# Patient Record
Sex: Male | Born: 1940 | Race: Black or African American | Hispanic: No | Smoking: Former smoker
Health system: Southern US, Community
[De-identification: ages and names within clinical notes are randomized; demographics above are authoritative.]

## PROBLEM LIST (undated history)

## (undated) DIAGNOSIS — S0990XA Unspecified injury of head, initial encounter: Secondary | ICD-10-CM

## (undated) DIAGNOSIS — I2510 Atherosclerotic heart disease of native coronary artery without angina pectoris: Secondary | ICD-10-CM

## (undated) DIAGNOSIS — R42 Dizziness and giddiness: Secondary | ICD-10-CM

## (undated) HISTORY — PX: CARDIAC CATHETERIZATION: SHX172

## (undated) HISTORY — PX: CORONARY ANGIOPLASTY: SHX604

## (undated) HISTORY — PX: ANGIOPLASTY: SHX39

---

## 2013-12-29 ENCOUNTER — Encounter (HOSPITAL_COMMUNITY): Payer: Self-pay | Admitting: Emergency Medicine

## 2013-12-29 ENCOUNTER — Emergency Department (HOSPITAL_COMMUNITY): Payer: Medicare Other

## 2013-12-29 ENCOUNTER — Observation Stay (HOSPITAL_COMMUNITY)
Admission: EM | Admit: 2013-12-29 | Discharge: 2013-12-30 | Disposition: A | Discharge status: Home / Self Care | Payer: Medicare Other | Attending: Internal Medicine | Admitting: Internal Medicine

## 2013-12-29 DIAGNOSIS — Z87891 Personal history of nicotine dependence: Secondary | ICD-10-CM | POA: Insufficient documentation

## 2013-12-29 DIAGNOSIS — G459 Transient cerebral ischemic attack, unspecified: Principal | ICD-10-CM

## 2013-12-29 DIAGNOSIS — R42 Dizziness and giddiness: Secondary | ICD-10-CM | POA: Insufficient documentation

## 2013-12-29 DIAGNOSIS — I2510 Atherosclerotic heart disease of native coronary artery without angina pectoris: Secondary | ICD-10-CM | POA: Insufficient documentation

## 2013-12-29 HISTORY — DX: Atherosclerotic heart disease of native coronary artery without angina pectoris: I25.10

## 2013-12-29 HISTORY — DX: Dizziness and giddiness: R42

## 2013-12-29 HISTORY — DX: Unspecified injury of head, initial encounter: S09.90XA

## 2013-12-29 LAB — RAPID URINE DRUG SCREEN, HOSP PERFORMED
Amphetamines: NOT DETECTED
Barbiturates: NOT DETECTED
Benzodiazepines: NOT DETECTED
Cocaine: NOT DETECTED
Opiates: NOT DETECTED
Tetrahydrocannabinol: NOT DETECTED

## 2013-12-29 LAB — URINALYSIS, ROUTINE W REFLEX MICROSCOPIC
Bilirubin Urine: NEGATIVE
Glucose, UA: NEGATIVE mg/dL
Hgb urine dipstick: NEGATIVE
Ketones, ur: NEGATIVE mg/dL
Leukocytes, UA: NEGATIVE
Nitrite: NEGATIVE
Protein, ur: NEGATIVE mg/dL
Specific Gravity, Urine: 1.012 (ref 1.005–1.030)
Urobilinogen, UA: 0.2 mg/dL (ref 0.0–1.0)
pH: 6 (ref 5.0–8.0)

## 2013-12-29 LAB — CBC
HCT: 38.8 % — ABNORMAL LOW (ref 39.0–52.0)
Hemoglobin: 13 g/dL (ref 13.0–17.0)
MCH: 29.2 pg (ref 26.0–34.0)
MCHC: 33.5 g/dL (ref 30.0–36.0)
MCV: 87.2 fL (ref 78.0–100.0)
Platelets: 348 10*3/uL (ref 150–400)
RBC: 4.45 MIL/uL (ref 4.22–5.81)
RDW: 12.9 % (ref 11.5–15.5)
WBC: 6.4 K/uL (ref 4.0–10.5)

## 2013-12-29 LAB — COMPREHENSIVE METABOLIC PANEL
ALT: 6 U/L (ref 0–53)
Albumin: 4.1 g/dL (ref 3.5–5.2)
Alkaline Phosphatase: 75 U/L (ref 39–117)
Glucose, Bld: 87 mg/dL (ref 70–99)
Potassium: 3.8 mEq/L (ref 3.7–5.3)
Sodium: 139 mEq/L (ref 137–147)
Total Protein: 7.2 g/dL (ref 6.0–8.3)

## 2013-12-29 LAB — COMPREHENSIVE METABOLIC PANEL WITH GFR
AST: 22 U/L (ref 0–37)
BUN: 13 mg/dL (ref 6–23)
CO2: 24 meq/L (ref 19–32)
Calcium: 9.3 mg/dL (ref 8.4–10.5)
Chloride: 103 meq/L (ref 96–112)
Creatinine, Ser: 1.31 mg/dL (ref 0.50–1.35)
GFR calc Af Amer: 62 mL/min — ABNORMAL LOW (ref >90)
GFR calc non Af Amer: 53 mL/min — ABNORMAL LOW (ref >90)
Total Bilirubin: 0.2 mg/dL — ABNORMAL LOW (ref 0.3–1.2)

## 2013-12-29 LAB — DIFFERENTIAL
Basophils Absolute: 0 K/uL (ref 0.0–0.1)
Basophils Relative: 0 % (ref 0–1)
Eosinophils Absolute: 0.1 K/uL (ref 0.0–0.7)
Eosinophils Relative: 1 % (ref 0–5)
Lymphocytes Relative: 14 % (ref 12–46)
Lymphs Abs: 0.9 K/uL (ref 0.7–4.0)
Monocytes Absolute: 0.5 K/uL (ref 0.1–1.0)
Monocytes Relative: 7 % (ref 3–12)
Neutro Abs: 5 K/uL (ref 1.7–7.7)
Neutrophils Relative %: 78 % — ABNORMAL HIGH (ref 43–77)

## 2013-12-29 LAB — I-STAT CHEM 8, ED
BUN: 12 mg/dL (ref 6–23)
Calcium, Ion: 1.19 mmol/L (ref 1.13–1.30)
Chloride: 103 mEq/L (ref 96–112)
Creatinine, Ser: 1.4 mg/dL — ABNORMAL HIGH (ref 0.50–1.35)
Glucose, Bld: 87 mg/dL (ref 70–99)
HCT: 41 % (ref 39.0–52.0)
Hemoglobin: 13.9 g/dL (ref 13.0–17.0)
Potassium: 3.9 mEq/L (ref 3.7–5.3)
Sodium: 143 mEq/L (ref 137–147)
TCO2: 26 mmol/L (ref 0–100)

## 2013-12-29 LAB — I-STAT TROPONIN, ED: Troponin i, poc: 0 ng/mL (ref 0.00–0.08)

## 2013-12-29 LAB — PROTIME-INR
INR: 0.94 (ref 0.00–1.49)
Prothrombin Time: 12.4 s (ref 11.6–15.2)

## 2013-12-29 LAB — APTT: aPTT: 31 s (ref 24–37)

## 2013-12-29 LAB — ETHANOL: Alcohol, Ethyl (B): <11 mg/dL (ref 0–11)

## 2013-12-29 MED ORDER — ASPIRIN 81 MG PO CHEW
162.0000 mg | CHEWABLE_TABLET | Freq: Once | ORAL | Status: AC
  Administered 2013-12-29: 162 mg via ORAL
  Filled 2013-12-29: qty 2

## 2013-12-29 NOTE — ED Notes (Signed)
Ginger ale, Malawiturkey sandwich, applesauce provided to the patient.

## 2013-12-29 NOTE — H&P (Signed)
[facility] History and Physical  [name] [name] EAV:[id number] DOB: [birth year] DOA: 12/29/2013  Referring physician: ER physician. PCP: No PCP Per Patient patient just moved from [place] in last December.  Chief Complaint: Difficulty speaking.  HPI: [name] [name] is a 73 y.o. male with history of CAD status post stenting and blunt head trauma as a child presents to the ER because of difficulty speaking. Patient's symptoms happened when he was with his nephew at his house at around 5 PM and his symptoms lasted for around 5 minutes and resolved completely. Patient was in the standing position when he suddenly developed difficulty bringing out words and also felt dizzy and some visual blurriness. He stated that he also felt confused. Patient states that he has had previous episodes of these and in 2013 was admitted in a hospital in [place]. In the ER patient was found to be nonfocal and CT head did not show anything acute. On-call neurologist Dr. Roseanne RenoStewart was consulted by ED physician Dr. Oletta LamasGhim and [facility] work[facility]. Patient denies any chest pain shortness of breath nausea vomiting abdominal pain diarrhea.   Review of Systems: As presented in the history of presenting illness, rest negative.  Past Medical History  Diagnosis Date  . Head trauma in child   . Vertigo   . Coronary artery disease    Past Surgical History  Procedure Laterality Date  . Angioplasty    . Cardiac catheterization    . Coronary angioplasty     Social History:  reports that he quit smoking about 20 years ago. His smoking use included Cigarettes. He smoked 0.00 packs per day for 15 years. He has never used smokeless tobacco. He reports that he does not drink alcohol or use illicit drugs. Where does patient live home. Can patient participate in ADLs? Yes.  No Known Allergies  Family History:  Family History  Problem Relation Age of Onset  . Cancer Mother    . Stroke Father       Prior to Admission medications   Not on File    Physical Exam: Filed Vitals:   12/29/13 2030 12/29/13 2100 12/29/13 2130 12/29/13 2200  BP: 122/82 131/76 130/83 127/79  Pulse: 62 61 63 67  Temp:      TempSrc:      Resp: 19 20 20 14   SpO2: 100% 99% 98% 99%     General:  Well-developed and moderately nourished.  Eyes: Anicteric no pallor.  ENT: No discharge from the ears eyes nose mouth.  Neck: No mass felt.  Cardiovascular: S1-S2 heard.  Respiratory: No rhonchi or crepitations.  Abdomen: Soft nontender bowel sounds present. No guarding or rigidity.  Skin: No rash.  Musculoskeletal: No edema.  Psychiatric: Appears normal.  Neurologic: Alert awake oriented to time place and person. Moves all extremities 5 x 5. No facial asymmetry. Tongue is midline.  Labs on Admission:  Basic Metabolic Panel:  Recent Labs Lab 12/29/13 1853 12/29/13 1917  NA 139 143  K 3.8 3.9  CL 103 103  CO2 24  --   GLUCOSE 87 87  BUN 13 12  CREATININE 1.31 1.40*  CALCIUM 9.3  --    Liver Function Tests:  Recent Labs Lab 12/29/13 1853  AST 22  ALT 6  ALKPHOS 75  BILITOT 0.2*  PROT 7.2  ALBUMIN 4.1   No results found for this basename: LIPASE, AMYLASE,  in the last 168 hours No results found for  this basename: AMMONIA,  in the last 168 hours CBC:  Recent Labs Lab 12/29/13 1853 12/29/13 1917  WBC 6.4  --   NEUTROABS 5.0  --   HGB 13.0 13.9  HCT 38.8* 41.0  MCV 87.2  --   PLT 348  --    Cardiac Enzymes: No results found for this basename: CKTOTAL, CKMB, CKMBINDEX, TROPONINI,  in the last 168 hours  BNP (last 3 results) No results found for this basename: PROBNP,  in the last 8760 hours CBG: No results found for this basename: GLUCAP,  in the last 168 hours  Radiological Exams on Admission: Ct Head Wo Contrast  12/29/2013   CLINICAL DATA:  Temporary episode of aphasia with altered mental status.  EXAM: CT HEAD WITHOUT CONTRAST  TECHNIQUE:  Contiguous axial images were obtained from the base of the skull through the vertex without intravenous contrast.  COMPARISON:  None.  FINDINGS: No evidence of an acute infarct, acute hemorrhage, mass lesion, mass effect or hydrocephalus. Mild atrophy. Mild periventricular low attenuation. Visualized portions of the paranasal sinuses and mastoid air cells are clear.  IMPRESSION: 1. No acute intracranial abnormality. 2. Atrophy and chronic microvascular white matter ischemic changes.   Electronically Signed   By: Leanna BattlesMelinda  Blietz M.D.   On: 12/29/2013 19:58    EKG: Independently reviewed. Normal sinus rhythm with early repolarization changes. No old EKG to compare. I did discuss with on-call cardiologist Dr. Terressa KoyanagiBalfour.  Assessment/Plan Principal Problem:   TIA (transient ischemic attack) Active Problems:   CAD (coronary artery disease)   1. TIA - patient's symptoms are consistent with possible TIA. At this time [facility] and patient is in agreement for transfer. Dr. Lynden OxfordPranav Patel will be the accepting physician. On-call neurologist aware of transfer. Patient will be placed on neurochecks swallow evaluation. Check MRI/MRA brain, carotid Doppler, 2-D echo, lipid panel and hemoglobin A1c. Aspirin. Monitor in telemetry for any arrhythmias. 2. History of CAD status post stenting - denies any chest pain. I did discuss the case with on-call cardiologist Dr. Terressa KoyanagiBalfour. Dr. Terressa KoyanagiBalfour feels the EKG changes are consistent with early repolarization.    Code Status: Full code.  Family Communication: Patient's nephew at the bedside.  Disposition Plan: Admit for observation.    Eduard ClosArshad N Sephira Zellman [facility] Pager [phone].  If 7PM-7AM, please contact night-coverage [email or web address] Password [email or web address] 12/29/2013, 10:18 PM

## 2013-12-29 NOTE — Progress Notes (Signed)
  CARE MANAGEMENT ED NOTE 12/29/2013  Patient:  [name] [name]   Account Number:  [id number]  Date Initiated:  12/29/2013  Documentation initiated by:  Radford PaxFERRERO,Georgeana Oertel  Subjective/Objective Assessment:   Patient presents to Ed with speech difficulties and dizziness.     Subjective/Objective Assessment Detail:   Patient without prior history of TIA or stroke.     Action/Plan:   Action/Plan Detail:   Anticipated DC Date:       Status Recommendation to Physician:   Result of Recommendation:    Other ED Services  Consult Working Plan    DC Planning Services  Other  PCP issues    Choice offered to / List presented to:            Status of service:  Completed, signed off  ED Comments:   ED Comments Detail:  EDCM spoke to patient's nephew at bedside as patient was having CT scan completed.  Patient's nephew Ed reports patient is much better now.  Patient without a pcp with Medicare insurance, [place]  [facility] provided patient's nephew with list of physicians who accept Medicare insurance within a five mile radius of patient's zip code.  Patient came back from CT and advised him of the same.  No further EDCM needs at this time.

## 2013-12-29 NOTE — ED Provider Notes (Signed)
CSN: [id number]     Arrival date & time 12/29/13  1744 History   First MD Initiated Contact with Patient 12/29/13 1840     Chief Complaint  Patient presents with  . Altered Mental Status  . Aphasia     (Consider location/radiation/quality/duration/timing/severity/associated sxs/prior Treatment) HPI Comments: Pt with h/o possible angioplasty in [place] years ago, now on no medications, had exercised at home, walking outside for about 1 hour, gotten home, was doing chores and began cooking.  Nephew who lives with pt got home around 5:30 and pt had come upstairs to talk to nephwe.  Pt reports feeling vertigo like symptoms of dizziness, and when he tried to speak to nephew, only gibberish was coming out, couldn't speak.  Pt reports some visual changes, non specific.  Pt insists he knew what he wanted to say but couldn't get it out.  No definite right or left arm or leg weakness.  No prior h/o stroke, no smoking or drinking alcohol  No family h/o stroke.  Pt's symptoms much improved now.    Patient is a 73 y.o. male presenting with altered mental status. The history is provided by the patient and a relative.  Altered Mental Status Severity:  Moderate Most recent episode:  Today Episode history:  Single Duration:  1 hour Timing:  Constant Progression:  Improving Chronicity:  New Context: not alcohol use, not dementia, not drug use, not head injury, not homeless, taking medications as prescribed, not a nursing home resident and not a recent infection   Associated symptoms: light-headedness   Associated symptoms: no abdominal pain, no difficulty breathing, no fever, no nausea, no vomiting and no weakness     Past Medical History  Diagnosis Date  . Head trauma in child   . Vertigo    Past Surgical History  Procedure Laterality Date  . Angioplasty     History reviewed. No pertinent family history. History  Substance Use Topics  . Smoking status: Former Smoker -- 15 years    Types:  Cigarettes    Quit date: 12/29/1993  . Smokeless tobacco: Never Used  . Alcohol Use: No    Review of Systems  Constitutional: Negative for fever and chills.  Eyes: Positive for visual disturbance.  Respiratory: Negative for chest tightness.   Cardiovascular: Negative for chest pain.  Gastrointestinal: Negative for nausea, vomiting and abdominal pain.  Musculoskeletal: Negative for back pain.  Neurological: Positive for dizziness, speech difficulty and light-headedness. Negative for weakness.  All other systems reviewed and are negative.     Allergies  Review of patient's allergies indicates no known allergies.  Home Medications   Prior to Admission medications   Not on File   BP 125/79  Pulse 62  Temp(Src) 98.7 F (37.1 C) (Oral)  Resp 17  SpO2 99% Physical Exam  Nursing note and vitals reviewed. Constitutional: He is oriented to person, place, and time. He appears well-developed and well-nourished.  HENT:  Head: Normocephalic and atraumatic.  Eyes: Conjunctivae are normal. No scleral icterus.  Neck: Normal range of motion. Neck supple.  Cardiovascular: Normal rate, regular rhythm and intact distal pulses.   No murmur heard. Pulmonary/Chest: Effort normal. No respiratory distress. He has no wheezes. He has no rales.  Abdominal: Soft. He exhibits no distension. There is no tenderness. There is no rebound and no guarding.  Neurological: He is alert and oriented to person, place, and time. He displays normal reflexes. No cranial nerve deficit. He exhibits normal muscle tone. Coordination normal.  No arm or leg drift, peripheral vision intact, CN 2-12 intact.  5/5 distal strength in B UE and LE's.  Normal finger to nose.  Slight hesitancy in speech in my opinion, but family and pt both report speech is now at baseline  Skin: Skin is warm and dry. No rash noted.  Psychiatric: He has a normal mood and affect.    ED Course  Procedures (including critical care  time)  CRITICAL CARE Performed by: Gavin PoundMichael Y. Chrisean Kloth Total critical care time: 30 min Critical care time was exclusive of separately billable procedures and treating other patients. Critical care was necessary to treat or prevent imminent or life-threatening deterioration. Critical care was time spent personally by me on the following activities: development of treatment plan with patient and/or surrogate as well as nursing, discussions with consultants, evaluation of patient's response to treatment, examination of patient, obtaining history from patient or surrogate, ordering and performing treatments and interventions, ordering and review of laboratory studies, ordering and review of radiographic studies, pulse oximetry and re-evaluation of patient's condition.    Labs Review Labs Reviewed  CBC - Abnormal; Notable for the following:    HCT 38.8 (*)    All other components within normal limits  DIFFERENTIAL - Abnormal; Notable for the following:    Neutrophils Relative % 78 (*)    All other components within normal limits  COMPREHENSIVE METABOLIC PANEL - Abnormal; Notable for the following:    Total Bilirubin 0.2 (*)    GFR calc non Af Amer 53 (*)    GFR calc Af Amer 62 (*)    All other components within normal limits  I-STAT CHEM 8, ED - Abnormal; Notable for the following:    Creatinine, Ser 1.40 (*)    All other components within normal limits  ETHANOL  PROTIME-INR  APTT  URINE RAPID DRUG SCREEN (HOSP PERFORMED)  URINALYSIS, ROUTINE W REFLEX MICROSCOPIC  I-STAT TROPOININ, ED    Imaging Review Ct Head Wo Contrast  12/29/2013   CLINICAL DATA:  Temporary episode of aphasia with altered mental status.  EXAM: CT HEAD WITHOUT CONTRAST  TECHNIQUE: Contiguous axial images were obtained from the base of the skull through the vertex without intravenous contrast.  COMPARISON:  None.  FINDINGS: No evidence of an acute infarct, acute hemorrhage, mass lesion, mass effect or hydrocephalus.  Mild atrophy. Mild periventricular low attenuation. Visualized portions of the paranasal sinuses and mastoid air cells are clear.  IMPRESSION: 1. No acute intracranial abnormality. 2. Atrophy and chronic microvascular white matter ischemic changes.   Electronically Signed   By: Leanna BattlesMelinda  Blietz M.D.   On: 12/29/2013 19:58     EKG Interpretation   Date/Time:  Monday December 29 2013 18:46:31 EDT Ventricular Rate:  67 PR Interval:  171 QRS Duration: 87 QT Interval:  399 QTC Calculation: 421 R Axis:   -43 Text Interpretation:  Sinus rhythm Left anterior fascicular block  Borderline ST elevation, anterolateral leads Abnormal ekg No previous  tracing Confirmed by [facility]  MD, MICHEAL ([id number]) on 12/29/2013 7:05:56 PM     RA sat is 100% and I interpret to be normal  8:23 PM Reviewed head CT and results.  I spoke to Dr. Roseanne RenoStewart, neuro hospitalist who agrees [facility].  I have informed pt and family.  Awaiting admission to hospitalist.  ASA ordered     MDM   Final diagnoses:  TIA (transient ischemic attack)    Pt with symptoms consistent with  a TIA.  AT this point, symptoms of CVA are resolved, no difficulty with word finding or speech.  Thus not a TPA candidate.  Will need admission for TIA.  No PCP as he moved here from [place] 5 months ago, has not established with anyone.      Gavin PoundMichael Y. Oletta LamasGhim, MD 12/29/13 2023

## 2013-12-29 NOTE — ED Notes (Signed)
Hospitalist at bedside 

## 2013-12-29 NOTE — ED Notes (Signed)
Biotene mouthwash provided to pt per request.

## 2013-12-29 NOTE — ED Notes (Addendum)
Pt from home c/o trouble speaking and pt states " I had a vertigo feeling after walking/running" Pt alert and oriented. Speech symptoms resolved. Nephew feels pt is back to normal.Last seen "normal" was at 4:30pm today.

## 2013-12-29 NOTE — ED Notes (Signed)
Per family, pt was exercising about 1 1/2 hours ago.  Pt had speech changes and could not get his words for about 5 min.  Per family speech is back to normal.  Pt bp checked by family and stable during event.

## 2013-12-29 NOTE — ED Notes (Signed)
[facility] at bedside 

## 2013-12-29 NOTE — ED Notes (Signed)
Pt returned from CT °

## 2013-12-29 NOTE — ED Notes (Signed)
MD Ghim at bedside. 

## 2013-12-30 ENCOUNTER — Observation Stay (HOSPITAL_COMMUNITY): Payer: Medicare Other

## 2013-12-30 DIAGNOSIS — I359 Nonrheumatic aortic valve disorder, unspecified: Secondary | ICD-10-CM

## 2013-12-30 LAB — TSH: TSH: 3.02 u[IU]/mL (ref 0.350–4.500)

## 2013-12-30 LAB — LIPID PANEL
Cholesterol: 175 mg/dL (ref 0–200)
HDL: 49 mg/dL (ref >39)
LDL Cholesterol: 112 mg/dL — ABNORMAL HIGH (ref 0–99)
Total CHOL/HDL Ratio: 3.6 RATIO
Triglycerides: 68 mg/dL (ref <150)
VLDL: 14 mg/dL (ref 0–40)

## 2013-12-30 LAB — CBC WITH DIFFERENTIAL/PLATELET
Basophils Absolute: 0 10*3/uL (ref 0.0–0.1)
Basophils Relative: 1 % (ref 0–1)
Eosinophils Absolute: 0.1 10*3/uL (ref 0.0–0.7)
Eosinophils Relative: 2 % (ref 0–5)
HCT: 37.8 % — ABNORMAL LOW (ref 39.0–52.0)
Hemoglobin: 12.8 g/dL — ABNORMAL LOW (ref 13.0–17.0)
LYMPHS PCT: 23 % (ref 12–46)
Lymphs Abs: 1.2 10*3/uL (ref 0.7–4.0)
MCH: 30 pg (ref 26.0–34.0)
MCHC: 33.9 g/dL (ref 30.0–36.0)
MCV: 88.7 fL (ref 78.0–100.0)
Monocytes Absolute: 0.5 10*3/uL (ref 0.1–1.0)
Monocytes Relative: 10 % (ref 3–12)
Neutro Abs: 3.4 10*3/uL (ref 1.7–7.7)
Neutrophils Relative %: 64 % (ref 43–77)
PLATELETS: 312 10*3/uL (ref 150–400)
RBC: 4.26 MIL/uL (ref 4.22–5.81)
RDW: 13.2 % (ref 11.5–15.5)
WBC: 5.3 10*3/uL (ref 4.0–10.5)

## 2013-12-30 LAB — COMPREHENSIVE METABOLIC PANEL
ALBUMIN: 3.4 g/dL — AB (ref 3.5–5.2)
ALT: 7 U/L (ref 0–53)
AST: 18 U/L (ref 0–37)
Alkaline Phosphatase: 68 U/L (ref 39–117)
BILIRUBIN TOTAL: 0.2 mg/dL — AB (ref 0.3–1.2)
BUN: 14 mg/dL (ref 6–23)
CHLORIDE: 107 meq/L (ref 96–112)
CO2: 25 mEq/L (ref 19–32)
Calcium: 9.5 mg/dL (ref 8.4–10.5)
Creatinine, Ser: 1.2 mg/dL (ref 0.50–1.35)
GFR calc Af Amer: 68 mL/min — ABNORMAL LOW (ref >90)
GFR calc non Af Amer: 59 mL/min — ABNORMAL LOW (ref >90)
Glucose, Bld: 88 mg/dL (ref 70–99)
POTASSIUM: 3.9 meq/L (ref 3.7–5.3)
SODIUM: 143 meq/L (ref 137–147)
Total Protein: 6.5 g/dL (ref 6.0–8.3)

## 2013-12-30 LAB — TROPONIN I
Troponin I: <0.3 ng/mL (ref <0.30)
Troponin I: <0.3 ng/mL (ref <0.30)

## 2013-12-30 LAB — HEMOGLOBIN A1C
Hgb A1c MFr Bld: 5.9 % — ABNORMAL HIGH (ref <5.7)
MEAN PLASMA GLUCOSE: 123 mg/dL — AB (ref <117)

## 2013-12-30 LAB — GLUCOSE, CAPILLARY
GLUCOSE-CAPILLARY: 91 mg/dL (ref 70–99)
Glucose-Capillary: 85 mg/dL (ref 70–99)

## 2013-12-30 MED ORDER — SIMVASTATIN 10 MG PO TABS
10.0000 mg | ORAL_TABLET | Freq: Every day | ORAL | Status: DC
  Filled 2013-12-30: qty 1

## 2013-12-30 MED ORDER — SODIUM CHLORIDE 0.9 % IV SOLN
INTRAVENOUS | Status: DC
  Administered 2013-12-30: 1000 mL via INTRAVENOUS

## 2013-12-30 MED ORDER — SIMVASTATIN 10 MG PO TABS: 10.0000 mg | ORAL_TABLET | Freq: Every day | ORAL | Status: AC

## 2013-12-30 MED ORDER — ACETAMINOPHEN 325 MG PO TABS: 650.0000 mg | ORAL_TABLET | ORAL | Status: DC | PRN

## 2013-12-30 MED ORDER — ASPIRIN 325 MG PO TABS
325.0000 mg | ORAL_TABLET | Freq: Every day | ORAL | Status: DC
  Filled 2013-12-30: qty 1

## 2013-12-30 MED ORDER — ASPIRIN EC 325 MG PO TBEC: 325.0000 mg | DELAYED_RELEASE_TABLET | Freq: Every day | ORAL | Status: AC

## 2013-12-30 MED ORDER — ENOXAPARIN SODIUM 40 MG/0.4ML ~~LOC~~ SOLN
40.0000 mg | SUBCUTANEOUS | Status: DC
  Filled 2013-12-30: qty 0.4

## 2013-12-30 NOTE — Progress Notes (Signed)
PT Cancellation Note  Patient Details Name: Gwinda PasseWinfred Dierks MRN: [id number] DOB: 12-Jun-1941   Cancelled Treatment:    Reason Eval/Treat Not Completed: Patient at procedure or test/unavailable   Vidur Knust B Loucille Takach 12/30/2013, 8:11 AM Delaney MeigsMaija Tabor Parveen Freehling, PT [phone]

## 2013-12-30 NOTE — Progress Notes (Signed)
Follow-up:  Was asked to see pt s/p [facility]. [facility] on the evening of 12/29/13 reporting an episode of difficulty speaking. Symptoms were also associated w/ some dizziness and blurred vision. In ED pt's exam was noted to be non-focal and a Ct head was w/o acute findings. Neurology was consulted and requested [facility] be [facility]. At bedside pt noted awake, alert and oriented x 3. He denies c/o at this time. Current VSS. Cardiac telemetry reveals NSR w/ rate of 66. Will continue to monitor closely on telemetry.   Leanne ChangKatherine P. Marjan Rosman, NP-C [facility] Pager [phone]

## 2013-12-30 NOTE — Evaluation (Signed)
Physical Therapy Evaluation/ Discharge Patient Details Name: [name] [name] MRN: [id number] DOB: [birth year] Today's Date: 12/30/2013   History of Present Illness  [name] [name] is a 73 y.o. male with history of CAD status post stenting and blunt head trauma as a child presents to the ER because of difficulty speaking. Patient's symptoms happened when he was with his nephew at his house at around 5 PM and his symptoms lasted for around 5 minutes and resolved completely. CT/ MRI (-)  Clinical Impression  Pt very pleasant with equal strength and sensation bil UE and bil LE. Pt without dizziness, LOB or difficulty with mobility with quick gait. Pt noted to have difficulty with smooth pursuit with visual tracking as well as visual saccades. Pt with improved visual tracking with practice but difficulty maintaining target throughout and pt educated for tracking and saccade exercises to improve coordination. Pt able to copy picture with all parts intact and able to read and follow directional signs without difficulty. Pt at baseline for mobility and balance with all education completed and no further needs, pt aware and agreeable, will sign off.     Follow Up Recommendations No PT follow up    Equipment Recommendations  None recommended by PT    Recommendations for Other Services       Precautions / Restrictions Precautions Precautions: None      Mobility  Bed Mobility Overal bed mobility: Independent                Transfers Overall transfer level: Independent                  Ambulation/Gait Ambulation/Gait assistance: Independent Ambulation Distance (Feet): 350 Feet Assistive device: None Gait Pattern/deviations: WFL(Within Functional Limits)        Stairs Stairs: Yes Stairs assistance: Independent Stair Management: No rails;Alternating pattern;Forwards Number of Stairs: 12    Wheelchair Mobility    Modified Rankin (Stroke Patients Only)       Balance  Overall balance assessment: No apparent balance deficits (not formally assessed)                                           Pertinent Vitals/Pain No pain VSS    Home Living Family/patient expects to be discharged to:: Private residence Living Arrangements: Other relatives Available Help at Discharge: Family;Available 24 hours/day Type of Home: Apartment Home Access: Stairs to enter   Entrance Stairs-Number of Steps: 8 Home Layout: Two level Home Equipment: None      Prior Function Level of Independence: Independent               Hand Dominance        Extremity/Trunk Assessment   Upper Extremity Assessment: Overall WFL for tasks assessed           Lower Extremity Assessment: Overall WFL for tasks assessed      Cervical / Trunk Assessment: Normal  Communication   Communication: No difficulties  Cognition Arousal/Alertness: Awake/alert Behavior During Therapy: WFL for tasks assessed/performed Overall Cognitive Status: Within Functional Limits for tasks assessed                      General Comments      Exercises        Assessment/Plan    PT Assessment Patent does not need any further PT services  PT Diagnosis  PT Problem List    PT Treatment Interventions     PT Goals (Current goals can be found in the Care Plan section) Acute Rehab PT Goals PT Goal Formulation: No goals set, d/c therapy    Frequency     Barriers to discharge        Co-evaluation               End of Session   Activity Tolerance: Patient tolerated treatment well Patient left: in bed;with call bell/phone within reach Nurse Communication: Mobility status    Functional Assessment Tool Used: clinical judgement Functional Limitation: Mobility: Walking and moving around Mobility: Walking and Moving Around Current Status ([id number](G8978): 0 percent impaired, limited or restricted Mobility: Walking and Moving Around Goal Status [phone](G8979): 0 percent  impaired, limited or restricted Mobility: Walking and Moving Around Discharge Status [phone](G8980): 0 percent impaired, limited or restricted    Time: 1215-1240 PT Time Calculation (min): 25 min   Charges:   PT Evaluation $Initial PT Evaluation Tier I: 1 Procedure PT Treatments $Therapeutic Activity: 8-22 mins   PT G Codes:   Functional Assessment Tool Used: clinical judgement Functional Limitation: Mobility: Walking and moving around    [employer]Maija B Patriciaann Rabanal 12/30/2013, 12:48 PM Delaney MeigsMaija Tabor Isel Skufca, PT [phone]

## 2013-12-30 NOTE — Progress Notes (Signed)
Stroke Team Progress Note  HISTORY [name] [name] is an 73 y.o. male history coronary artery disease and remote head trauma, presenting [facility] with transient blurring of vision as well as scotomas, lightheadedness and expressive aphasia. Patient was aware of what was being said to him and do what he wanted to say but had difficulty expressing himself. Speech output was nonsensical. There is no previous history of stroke nor TIA. Patient has not been on antiplatelet therapy. Onset of symptoms was at 5 PM today 12/29/2013. CT scan of his head showed no acute intracranial abnormality. Patient was not administered TPA secondary to deficits resolved. He was admitted and transferred to [facility] for further evaluation and treatment.  SUBJECTIVE No family is at the bedside.  Overall he feels his condition is stable. He believes in healthy living and does not believe in antithrombotics.  OBJECTIVE Most recent Vital Signs: Filed Vitals:   12/29/13 2310 12/30/13 0005 12/30/13 0200 12/30/13 0400  BP:  133/82 127/80 109/55  Pulse: 72 68 63 68  Temp:  98 F (36.7 C)  97.9 F (36.6 C)  TempSrc:      Resp: 22 20  18   Height:  5\' 9"  (1.753 m)    Weight:  70.308 kg (155 lb)    SpO2: 98% 100% 100% 100%   CBG (last 3)   Recent Labs  12/30/13 0835  GLUCAP 85    IV Fluid Intake:   . sodium chloride 1,000 mL (12/30/13 0200)    MEDICATIONS  . aspirin  325 mg Oral Daily  . enoxaparin (LOVENOX) injection  40 mg Subcutaneous Q24H   PRN:  acetaminophen  Diet:  Cardiac thin liquids Activity:   Bathroom privileges with assistance DVT Prophylaxis:  Lovenox 40 mg sq daily   CLINICALLY SIGNIFICANT STUDIES Basic Metabolic Panel:   Recent Labs Lab 12/29/13 1853 12/29/13 1917 12/30/13 0300  NA 139 143 143  K 3.8 3.9 3.9  CL 103 103 107  CO2 24  --  25  GLUCOSE 87 87 88  BUN 13 12 14   CREATININE 1.31 1.40* 1.20  CALCIUM 9.3  --  9.5   Liver Function Tests:   Recent Labs Lab 12/29/13 1853  12/30/13 0300  AST 22 18  ALT 6 7  ALKPHOS 75 68  BILITOT 0.2* 0.2*  PROT 7.2 6.5  ALBUMIN 4.1 3.4*   CBC:   Recent Labs Lab 12/29/13 1853 12/29/13 1917 12/30/13 0300  WBC 6.4  --  5.3  NEUTROABS 5.0  --  3.4  HGB 13.0 13.9 12.8*  HCT 38.8* 41.0 37.8*  MCV 87.2  --  88.7  PLT 348  --  312   Coagulation:   Recent Labs Lab 12/29/13 1853  LABPROT 12.4  INR 0.94   Cardiac Enzymes:   Recent Labs Lab 12/30/13 0300  TROPONINI <0.30   Urinalysis:   Recent Labs Lab 12/29/13 1933  COLORURINE YELLOW  LABSPEC 1.012  PHURINE 6.0  GLUCOSEU NEGATIVE  HGBUR NEGATIVE  BILIRUBINUR NEGATIVE  KETONESUR NEGATIVE  PROTEINUR NEGATIVE  UROBILINOGEN 0.2  NITRITE NEGATIVE  LEUKOCYTESUR NEGATIVE   Lipid Panel    Component Value Date/Time   CHOL 175 12/30/2013 0300   TRIG 68 12/30/2013 0300   HDL 49 12/30/2013 0300   CHOLHDL 3.6 12/30/2013 0300   VLDL 14 12/30/2013 0300   LDLCALC 112* 12/30/2013 0300   HgbA1C  No results found for this basename: HGBA1C    Urine Drug Screen:     Component Value Date/Time  LABOPIA NONE DETECTED 12/29/2013 1933   COCAINSCRNUR NONE DETECTED 12/29/2013 1933   LABBENZ NONE DETECTED 12/29/2013 1933   AMPHETMU NONE DETECTED 12/29/2013 1933   THCU NONE DETECTED 12/29/2013 1933   LABBARB NONE DETECTED 12/29/2013 1933    Alcohol Level:   Recent Labs Lab 12/29/13 1853  ETH <11    CT of the brain  12/29/2013   1. No acute intracranial abnormality. 2. Atrophy and chronic microvascular white matter ischemic changes.   MRI of the brain  12/30/2013    No acute infarct.  Mild small vessel disease type changes.  Cervical spondylotic changes with spinal stenosis and cord flattening C3-4 level.    MRA of the brain  12/30/2013    Exam is motion degraded. This limits evaluation for grading stenosis accurately or detecting small aneurysm. What can be stated with certainty is that there is flow within the major intracranial vessels with right vertebral artery  ending in a right posterior inferior cerebral artery distribution.  Bulbous appearance of the basilar tip directed towards the left. Small basilar tip aneurysm not excluded.   2D Echocardiogram    Carotid Doppler    CXR    EKG  normal sinus rhythm. For complete results please see formal report.   Therapy Recommendations   Physical Exam   Awake alert. Afebrile. Head is nontraumatic. Neck is supple without bruit. Hearing is normal. Cardiac exam no murmur or gallop. Lungs are clear to auscultation. Distal pulses are well felt. Neurological Exam ;  Awake  Alert oriented x 3. Normal speech and language.eye movements full without nystagmus.fundi were not visualized. Vision acuity and fields appear normal. Hearing is normal. Palatal movements are normal. Face symmetric. Tongue midline. Normal strength, tone, reflexes and coordination. Normal sensation. Gait deferred. ASSESSMENT [name] [name] is a 73 y.o. male presenting with Transient dizziness, visual changes and speech difficulty. Imaging negative for acute stroke. Dx:  TIA.   On no antithrombotics prior to admission. Now on aspirin 325 mg orally every day for secondary stroke prevention. Patient with no resultant neuro symptoms. Stroke work up underway.   CAD - angioplasty  Family hx stroke (father)  Hospital day # 1  TREATMENT/PLAN  Continue aspirin 325 mg orally every day for secondary stroke prevention.  Complete stroke workup - 2D, CD   Annie MainSharon Biby, MSN, RN, ANVP-BC, AGPCNP-BC [facility] Pager: [phone] 12/30/2013 9:40 AM  I have personally obtained a history, examined the patient, evaluated imaging results, and formulated the assessment and plan of care. I agree with the above.  Delia HeadyPramod Ruari Mudgett, MD  To contact Stroke Continuity provider, please refer to [email or web address]. After hours, contact General Neurology

## 2013-12-30 NOTE — Discharge Instructions (Signed)
STROKE/TIA DISCHARGE INSTRUCTIONS SMOKING Cigarette smoking nearly doubles your risk of having a stroke & is the single most alterable risk factor  If you smoke or have smoked in the last 12 months, you are advised to quit smoking for your health.  Most of the excess cardiovascular risk related to smoking disappears within a year of stopping.  Ask you doctor about anti-smoking medications  [place] Quit Line: 1-800-QUIT NOW  Free Smoking Cessation Classes [phone]  CHOLESTEROL Know your levels; limit fat & cholesterol in your diet  Lipid Panel     Component Value Date/Time   CHOL 175 12/30/2013 0300   TRIG 68 12/30/2013 0300   HDL 49 12/30/2013 0300   CHOLHDL 3.6 12/30/2013 0300   VLDL 14 12/30/2013 0300   LDLCALC 112* 12/30/2013 0300      Many patients benefit from treatment even if their cholesterol is at goal.  Goal: Total Cholesterol (CHOL) less than 160  Goal:  Triglycerides (TRIG) less than 150  Goal:  HDL greater than 40  Goal:  LDL (LDLCALC) less than 100   BLOOD PRESSURE American Stroke Association blood pressure target is less that 120/80 mm/Hg  Your discharge blood pressure is:  BP: 109/55 mmHg  Monitor your blood pressure  Limit your salt and alcohol intake  Many individuals will require more than one medication for high blood pressure  DIABETES (A1c is a blood sugar average for last 3 months) Goal HGBA1c is under 7% (HBGA1c is blood sugar average for last 3 months)  Diabetes: No known diagnosis of diabetes    Lab Results  Component Value Date   HGBA1C 5.9* 12/30/2013     Your HGBA1c can be lowered with medications, healthy diet, and exercise.  Check your blood sugar as directed by your physician  Call your physician if you experience unexplained or low blood sugars.  PHYSICAL ACTIVITY/REHABILITATION Goal is 30 minutes at least 4 days per week  Activity: No restrictions. and Increase activity slowly, Therapies: Physical Therapy: N/A Return to work: N/A   Activity decreases your risk of heart attack and stroke and makes your heart stronger.  It helps control your weight and blood pressure; helps you relax and can improve your mood.  Participate in a regular exercise program.  Talk with your doctor about the best form of exercise for you (dancing, walking, swimming, cycling).  DIET/WEIGHT Goal is to maintain a healthy weight  Your discharge diet is: Cardiac thin liquids Your height is:  Height: 5\' 9"  (175.3 cm) Your current weight is: Weight: 70.308 kg (155 lb) Your Body Mass Index (BMI) is:  BMI (Calculated): 22.9  Following the type of diet specifically designed for you will help prevent another stroke.  Your goal weight range is:  144-176  Your goal Body Mass Index (BMI) is 19-24.  Healthy food habits can help reduce 3 risk factors for stroke:  High cholesterol, hypertension, and excess weight.  RESOURCES Stroke/Support Group:  Call [phone]   STROKE EDUCATION PROVIDED/REVIEWED AND GIVEN TO PATIENT Stroke warning signs and symptoms How to activate emergency medical system (call 911). Medications prescribed at discharge. Need for follow-up after discharge. Personal risk factors for stroke. Pneumonia vaccine given: No Flu vaccine given: No My questions have been answered, the writing is legible, and I understand these instructions.  I will adhere to these goals & educational materials that have been provided to me after my discharge from the hospital.

## 2013-12-30 NOTE — Discharge Summary (Signed)
Physician Discharge Summary  Patient ID: [name] [name] MRN: [id number] DOB/AGE: February 25, 1941 73 y.o.  Admit date: 12/29/2013 Discharge date: 12/30/2013  Primary Care Physician:  No PCP Per Patient  Discharge Diagnoses:    . TIA (transient ischemic attack) . CAD (coronary artery disease) Hyperlipidemia Noncompliance  Consults:  Neurology, Dr. Pearlean BrownieSethi    Allergies:  No Known Allergies   Discharge Medications:   Medication List         aspirin EC 325 MG tablet  Take 1 tablet (325 mg total) by mouth daily.     simvastatin 10 MG tablet  Commonly known as:  ZOCOR  Take 1 tablet (10 mg total) by mouth at bedtime.         Brief H and P: For complete details please refer to admission H and P, but in brief [name] [name] is a 73 y.o. male with history of CAD status post stenting and blunt head trauma as a child presents to the ER because of difficulty speaking. Patient's symptoms happened when he was with his nephew at his house at around 5 PM and his symptoms lasted for around 5 minutes and resolved completely. Patient was in the standing position when he suddenly developed difficulty bringing out words and also felt dizzy and some visual blurriness. He stated that he also felt confused. Patient states that he has had previous episodes of these and in 2013 [place]. In the ER patient was found to be nonfocal and CT head did not show anything acute.  Hospital Course:     TIA (transient ischemic attack): Patient presented with transient blurring of vision, lightheadedness and expressive aphasia. Patient was admitted for TIA/ stroke workup. Neurology was consulted patient underwent MRI of the brain which showed no acute infarct, no intracranial hemorrhage. MRA brain showed there is flow with the major intracranial vessels, no stenosis or thrombus seen. Carotid Dopplers showed 1-39% ICA stenosis. Patient was started on aspirin 325 mg daily Lipid panel showed  LDL 112, goal is under, patient started on statins 2-D echo showed EF 60-65%, no cardiac source of emboli. Patient did not seem interested in continuing aspirin or statins and states that he will continue to manage his health with his diet. He was explained the risk factors of stroke and high risk of having another neurological event after TIA/CVA if he did not follow the recoomendations. HbA1C 5.9, patient wants to maintain his health with diet.    CAD (coronary artery disease) - Continue aspirin and statin.     Day of Discharge BP 109/55  Pulse 68  Temp(Src) 97.9 F (36.6 C) (Oral)  Resp 18  Ht 5\' 9"  (1.753 m)  Wt 70.308 kg (155 lb)  BMI 22.88 kg/m2  SpO2 100%  Physical Exam: General: Alert and awake oriented x3 not in any acute distress. CVS: S1-S2 clear no murmur rubs or gallops Chest: clear to auscultation bilaterally, no wheezing rales or rhonchi Abdomen: soft nontender, nondistended, normal bowel sounds Extremities: no cyanosis, clubbing or edema noted bilaterally Neuro: Cranial nerves II-XII intact, no focal neurological deficits   The results of significant diagnostics from this hospitalization (including imaging, microbiology, ancillary and laboratory) are listed below for reference.    LAB RESULTS: Basic Metabolic Panel:  Recent Labs Lab 12/29/13 1853 12/29/13 1917 12/30/13 0300  NA 139 143 143  K 3.8 3.9 3.9  CL 103 103 107  CO2 24  --  25  GLUCOSE 87 87 88  BUN  13 12 14   CREATININE 1.31 1.40* 1.20  CALCIUM 9.3  --  9.5   Liver Function Tests:  Recent Labs Lab 12/29/13 1853 12/30/13 0300  AST 22 18  ALT 6 7  ALKPHOS 75 68  BILITOT 0.2* 0.2*  PROT 7.2 6.5  ALBUMIN 4.1 3.4*   No results found for this basename: LIPASE, AMYLASE,  in the last 168 hours No results found for this basename: AMMONIA,  in the last 168 hours CBC:  Recent Labs Lab 12/29/13 1853 12/29/13 1917 12/30/13 0300  WBC 6.4  --  5.3  NEUTROABS 5.0  --  3.4  HGB 13.0  13.9 12.8*  HCT 38.8* 41.0 37.8*  MCV 87.2  --  88.7  PLT 348  --  312   Cardiac Enzymes:  Recent Labs Lab 12/30/13 0300 12/30/13 0848  TROPONINI <0.30 <0.30   BNP: No components found with this basename: POCBNP,  CBG:  Recent Labs Lab 12/30/13 0835 12/30/13 1145  GLUCAP 85 91    Significant Diagnostic Studies:  Ct Head Wo Contrast  12/29/2013   CLINICAL DATA:  Temporary episode of aphasia with altered mental status.  EXAM: CT HEAD WITHOUT CONTRAST  TECHNIQUE: Contiguous axial images were obtained from the base of the skull through the vertex without intravenous contrast.  COMPARISON:  None.  FINDINGS: No evidence of an acute infarct, acute hemorrhage, mass lesion, mass effect or hydrocephalus. Mild atrophy. Mild periventricular low attenuation. Visualized portions of the paranasal sinuses and mastoid air cells are clear.  IMPRESSION: 1. No acute intracranial abnormality. 2. Atrophy and chronic microvascular white matter ischemic changes.   Electronically Signed   By: Leanna BattlesMelinda  Blietz M.D.   On: 12/29/2013 19:58   Mri Brain Without Contrast  12/30/2013   CLINICAL DATA:  Episode of difficulty speaking. Some dizziness and blurred vision.  EXAM: MRI HEAD WITHOUT CONTRAST  MRA HEAD WITHOUT CONTRAST  TECHNIQUE: Multiplanar, multiecho pulse sequences of the brain and surrounding structures were obtained without intravenous contrast. Angiographic images of the head were obtained using MRA technique without contrast.  COMPARISON:  12/29/2013 CT.  No comparison MR.  FINDINGS: MRI HEAD FINDINGS  No acute infarct.  No intracranial hemorrhage.  Mild white matter type changes most likely related to result of small vessel disease.  No intracranial mass lesion noted on this unenhanced exam.  No hydrocephalus.  Cervical spondylotic changes with spinal stenosis and cord flattening C3-4 level.  Cervical medullary junction, pituitary region and pineal region unremarkable.  Mild exophthalmos  MRA HEAD  FINDINGS  Exam is motion degraded. This limits evaluation for grading stenosis accurately or detecting small aneurysm. What can be stated with certainty is that there is flow within the major intracranial vessels with right vertebral artery ending in a right posterior inferior cerebral artery distribution.  Small caliber basilar artery partially explained by fetal type contribution to the posterior cerebral arteries.  Bulbous appearance of the basilar tip directed towards the left. Small basilar tip aneurysm not excluded.  IMPRESSION: MRI HEAD:  No acute infarct.  Mild small vessel disease type changes.  Cervical spondylotic changes with spinal stenosis and cord flattening C3-4 level.  MRA HEAD:  Exam is motion degraded. This limits evaluation for grading stenosis accurately or detecting small aneurysm. What can be stated with certainty is that there is flow within the major intracranial vessels with right vertebral artery ending in a right posterior inferior cerebral artery distribution.  Bulbous appearance of the basilar tip directed towards the left. Small basilar  tip aneurysm not excluded.   Electronically Signed   By: Bridgett Larsson M.D.   On: 12/30/2013 08:47   Mr Maxine Glenn Head/brain Wo Cm  12/30/2013   CLINICAL DATA:  Episode of difficulty speaking. Some dizziness and blurred vision.  EXAM: MRI HEAD WITHOUT CONTRAST  MRA HEAD WITHOUT CONTRAST  TECHNIQUE: Multiplanar, multiecho pulse sequences of the brain and surrounding structures were obtained without intravenous contrast. Angiographic images of the head were obtained using MRA technique without contrast.  COMPARISON:  12/29/2013 CT.  No comparison MR.  FINDINGS: MRI HEAD FINDINGS  No acute infarct.  No intracranial hemorrhage.  Mild white matter type changes most likely related to result of small vessel disease.  No intracranial mass lesion noted on this unenhanced exam.  No hydrocephalus.  Cervical spondylotic changes with spinal stenosis and cord flattening  C3-4 level.  Cervical medullary junction, pituitary region and pineal region unremarkable.  Mild exophthalmos  MRA HEAD FINDINGS  Exam is motion degraded. This limits evaluation for grading stenosis accurately or detecting small aneurysm. What can be stated with certainty is that there is flow within the major intracranial vessels with right vertebral artery ending in a right posterior inferior cerebral artery distribution.  Small caliber basilar artery partially explained by fetal type contribution to the posterior cerebral arteries.  Bulbous appearance of the basilar tip directed towards the left. Small basilar tip aneurysm not excluded.  IMPRESSION: MRI HEAD:  No acute infarct.  Mild small vessel disease type changes.  Cervical spondylotic changes with spinal stenosis and cord flattening C3-4 level.  MRA HEAD:  Exam is motion degraded. This limits evaluation for grading stenosis accurately or detecting small aneurysm. What can be stated with certainty is that there is flow within the major intracranial vessels with right vertebral artery ending in a right posterior inferior cerebral artery distribution.  Bulbous appearance of the basilar tip directed towards the left. Small basilar tip aneurysm not excluded.   Electronically Signed   By: Bridgett Larsson M.D.   On: 12/30/2013 08:47    2D ECHO: Study Conclusions  - Left ventricle: The cavity size was normal. There was mild concentric hypertrophy. Systolic function was normal. The estimated ejection fraction was in the range of 60% to 65%. Wall motion was normal; there were no regional wall motion abnormalities. Doppler parameters are consistent with abnormal left ventricular relaxation (grade 1 diastolic dysfunction). - Aortic valve: Mild regurgitation. - Aortic root: The aortic root was normal in size. - Mitral valve: Mild regurgitation. - Right ventricle: Systolic function was normal. - Right atrium: The atrium was normal in size. - Pulmonary  arteries: Systolic pressure was within the normal range. - Pericardium, extracardiac: There was no pericardial effusion.    Disposition and Follow-up:     Discharge Orders   Future Orders Complete By Expires   Diet - low sodium heart healthy  As directed    Increase activity slowly  As directed        DISPOSITION: home  DIET: heart healthy     DISCHARGE FOLLOW-UP Follow-up Information   Follow up with Gates Rigg, MD. Schedule an appointment as soon as possible for a visit in 2 months. (for TIA )    Specialties:  Neurology, Radiology   Contact information:   [address] [place] [address] [phone]       Time spent on Discharge: 35 mins  Signed:   Cathren Harsh M.D. [facility] 12/30/2013, 12:53 PM Pager: [phone]   **  Disclaimer: This note was dictated with voice recognition software. Similar sounding words can inadvertently be transcribed and this note may contain transcription errors which may not have been corrected upon publication of note.**

## 2013-12-30 NOTE — Progress Notes (Signed)
UR completed 

## 2013-12-30 NOTE — Progress Notes (Signed)
  Echocardiogram 2D Echocardiogram has been performed.  [name] [name] [name] 12/30/2013, 10:49 AM

## 2013-12-30 NOTE — Progress Notes (Signed)
Bilateral carotid artery duplex:  1-39% ICA stenosis.  Vertebral artery flow is antegrade.     

## 2013-12-30 NOTE — Consult Note (Signed)
[facility]  Chief Complaint: Transient dizziness, visual changes and speech difficulty.  HPI: [name] [name] is an 73 y.o. male history coronary artery disease and remote head trauma, presenting with transient blurring of vision as well as scotomas, lightheadedness and expressive aphasia. Patient was aware of what was being said to him and do what he wanted to say but had difficulty expressing himself. Speech output was nonsensical. There is no previous history of stroke nor TIA. Patient has not been on antiplatelet therapy. Onset of symptoms was at 5 PM today. CT scan of his head showed no acute intracranial abnormality.  LSN: 5 PM on 12/29/2013 tPA Given: No: Deficits resolved  MRankin: 0  Past Medical History  Diagnosis Date  . Head trauma in child   . Vertigo   . Coronary artery disease     Family History  Problem Relation Age of Onset  . Cancer Mother   . Stroke Father      Medications: I have reviewed the patient's current medications.  ROS: History obtained from the patient  General ROS: negative for - chills, fatigue, fever, night sweats, weight gain or weight loss Psychological ROS: negative for - behavioral disorder, hallucinations, memory difficulties, mood swings or suicidal ideation Ophthalmic ROS: negative for - blurry vision, double vision, eye pain or loss of vision ENT ROS: negative for - epistaxis, nasal discharge, oral lesions, sore throat, tinnitus or vertigo Allergy and Immunology ROS: negative for - hives or itchy/watery eyes Hematological and Lymphatic ROS: negative for - bleeding problems, bruising or swollen lymph nodes Endocrine ROS: negative for - galactorrhea, hair pattern changes, polydipsia/polyuria or temperature intolerance Respiratory ROS: negative for - cough, hemoptysis, shortness of breath or wheezing Cardiovascular ROS: negative for - chest pain, dyspnea on exertion, edema or irregular heartbeat Gastrointestinal ROS:  negative for - abdominal pain, diarrhea, hematemesis, nausea/vomiting or stool incontinence Genito-Urinary ROS: negative for - dysuria, hematuria, incontinence or urinary frequency/urgency Musculoskeletal ROS: negative for - joint swelling or muscular weakness Neurological ROS: as noted in HPI Dermatological ROS: negative for rash and skin lesion changes  Physical Examination: Blood pressure 133/82, pulse 68, temperature 98 F (36.7 C), temperature source Oral, resp. rate 20, height 5\' 9"  (1.753 m), weight 67.586 kg (149 lb), SpO2 100.00%.  Neurologic Examination: Mental Status: Alert, oriented, slightly agitated and very argumentative.  Speech fluent without evidence of aphasia. Able to follow commands without difficulty. Cranial Nerves: II-Visual fields were normal. III/IV/VI-Pupils were equal and reacted. Extraocular movements were full and conjugate.    V/VII-no facial numbness and no facial weakness. VIII-normal. X-normal speech and symmetrical palatal movement. Motor: 5/5 bilaterally with normal tone and bulk Sensory: Normal throughout. Deep Tendon Reflexes: 2+ and symmetric. Plantars: Flexor bilaterally Cerebellar: Normal finger-to-nose testing. Carotid auscultation: Normal  Ct Head Wo Contrast  12/29/2013   CLINICAL DATA:  Temporary episode of aphasia with altered mental status.  EXAM: CT HEAD WITHOUT CONTRAST  TECHNIQUE: Contiguous axial images were obtained from the base of the skull through the vertex without intravenous contrast.  COMPARISON:  None.  FINDINGS: No evidence of an acute infarct, acute hemorrhage, mass lesion, mass effect or hydrocephalus. Mild atrophy. Mild periventricular low attenuation. Visualized portions of the paranasal sinuses and mastoid air cells are clear.  IMPRESSION: 1. No acute intracranial abnormality. 2. Atrophy and chronic microvascular white matter ischemic changes.   Electronically Signed   By: Leanna BattlesMelinda  Blietz M.D.   On: 12/29/2013 19:58     Assessment: 73 y.o. male presenting with probable  transient ischemic attack, most likely subcortical left MCA territory. Small vessel ischemic stroke cannot be ruled out at this point.  Stroke Risk Factors - none  Plan: 1. HgbA1c, fasting lipid panel 2. MRI, MRA  of the brain without contrast 3. PT consult, OT consult, Speech consult 4. Echocardiogram 5. Carotid dopplers 6. Prophylactic therapy-Antiplatelet med: Aspirin  7. Risk factor modification 8. Telemetry monitoring   C.R. Roseanne RenoStewart, MD [facility] [phone]  12/30/2013, 1:02 AM

## 2015-07-16 IMAGING — CT CT HEAD W/O CM
2 series · 17 of 30 positions shown, 20 images · non-contrast
Comparison: None.

CLINICAL DATA: Temporary episode of aphasia with altered mental
status.

EXAM:
CT HEAD WITHOUT CONTRAST
TECHNIQUE: Contiguous axial images were obtained from the base of the skull
through the vertex without intravenous contrast.

[Series 2: head w/o · axial · non-contrast · 0.48mm/px · z∈[-177,-57]mm · 9 of 32 slices shown, 12 images]
[im 4/32  brain]
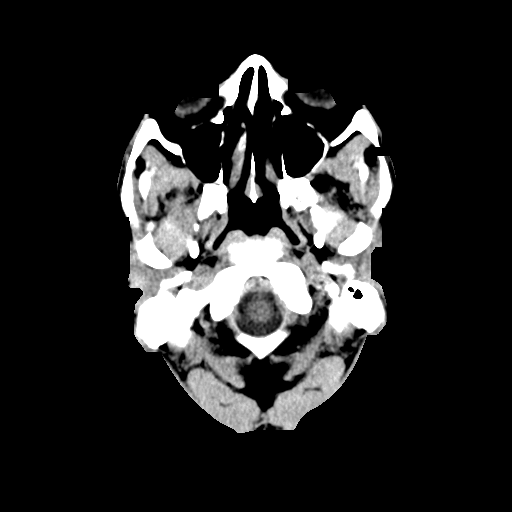
[im 4/32  bone]
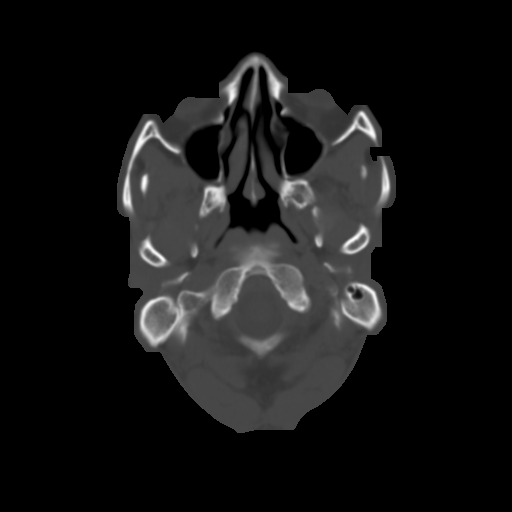
[im 7/32  brain]
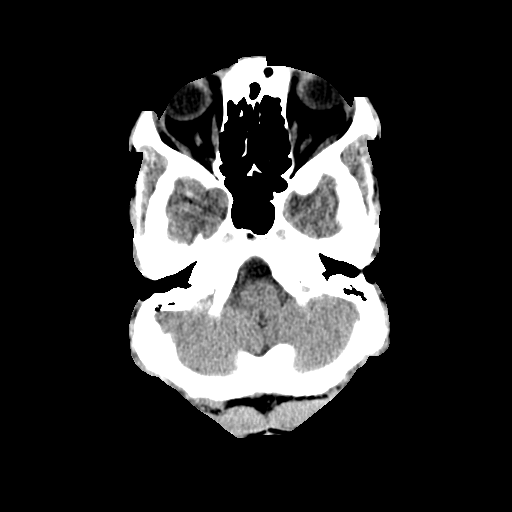
[im 10/32  brain]
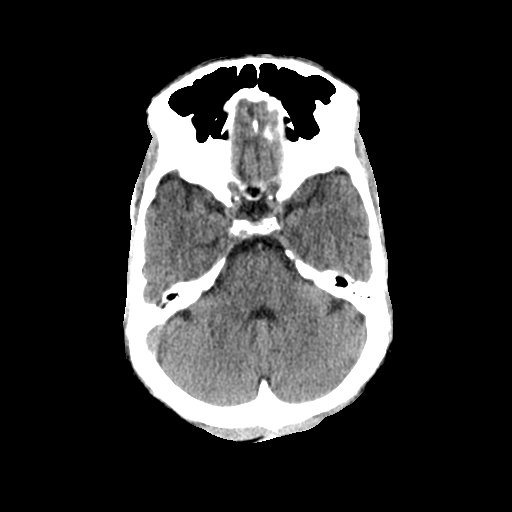
[im 13/32  brain]
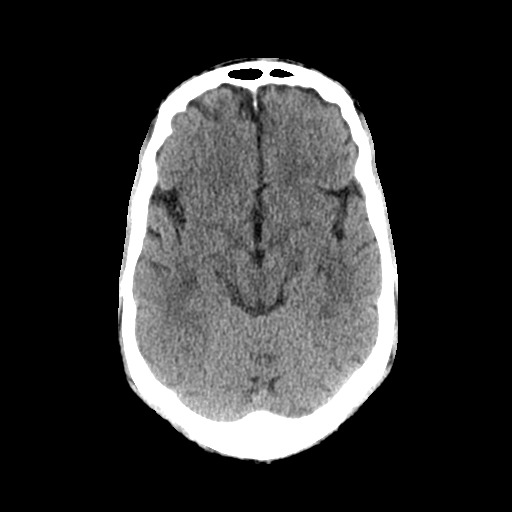
[im 16/32  brain]
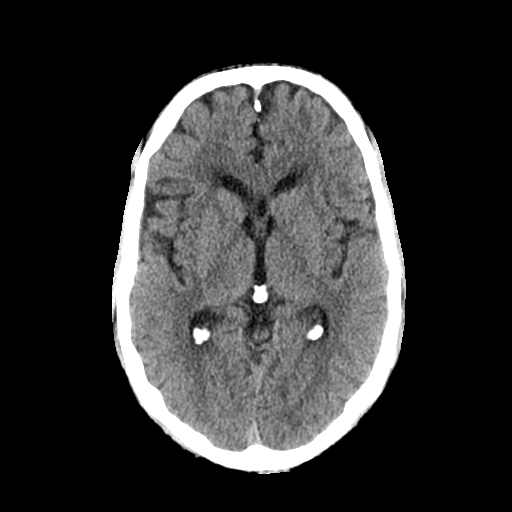
[im 16/32  bone]
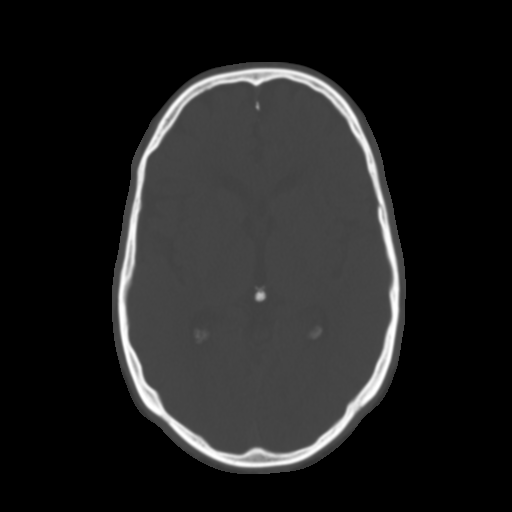
[im 19/32  brain]
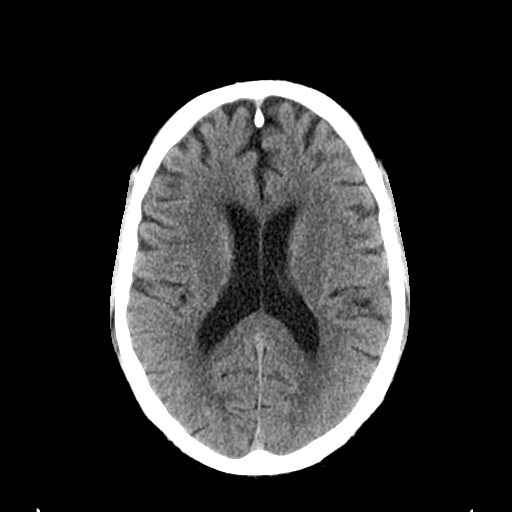
[im 22/32  brain]
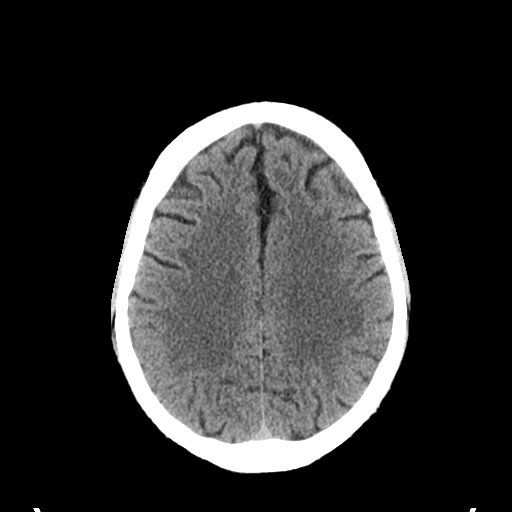
[im 25/32  brain]
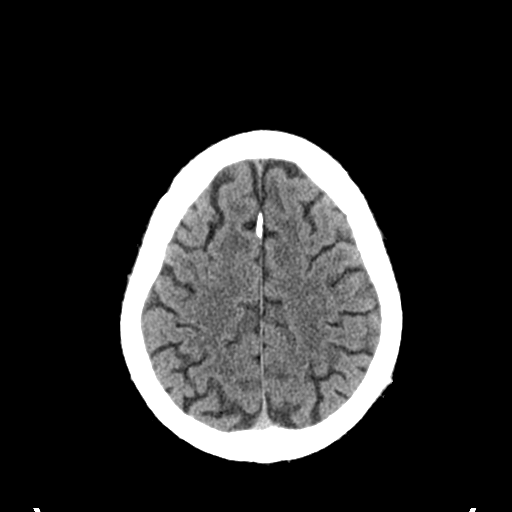
[im 28/32  brain]
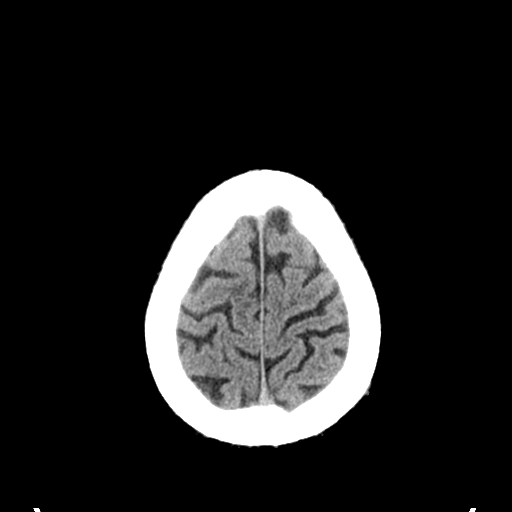
[im 28/32  bone]
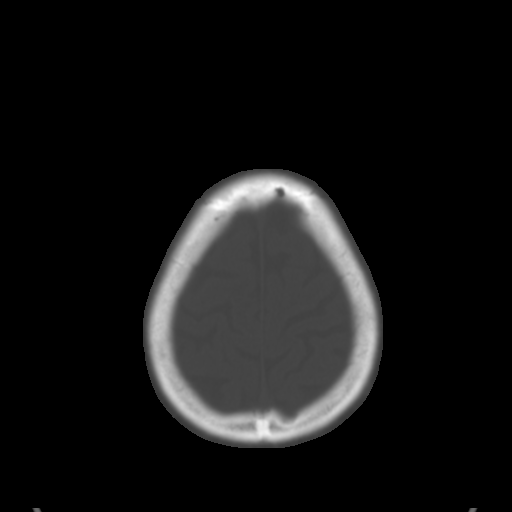

[Series 3: bone windows · axial · 0.48mm/px · z∈[-177,-54]mm · 8 of 53 slices shown]
[im 6/53  bone]
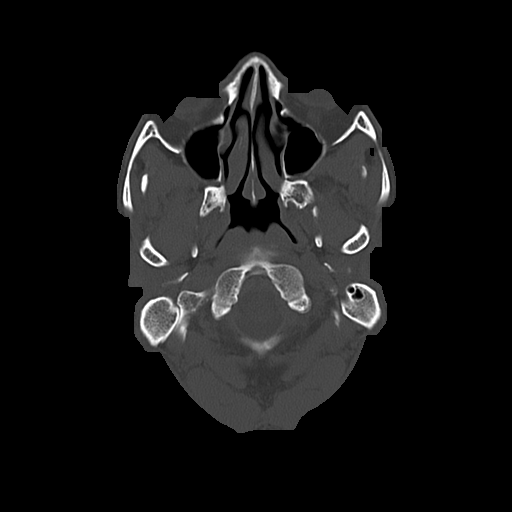
[im 12/53  bone]
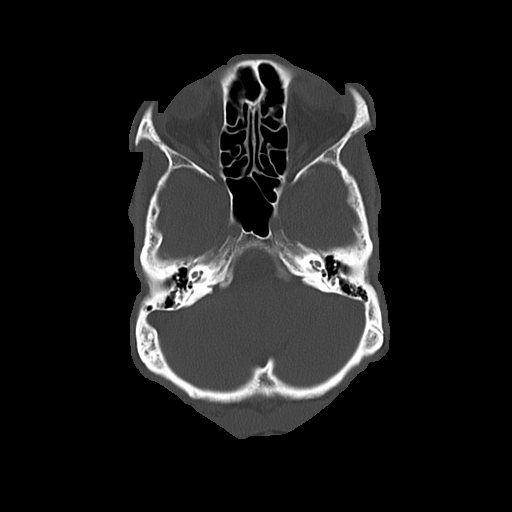
[im 18/53  bone]
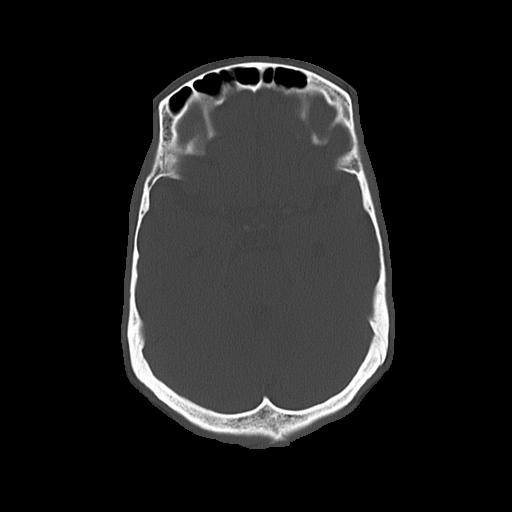
[im 24/53  bone]
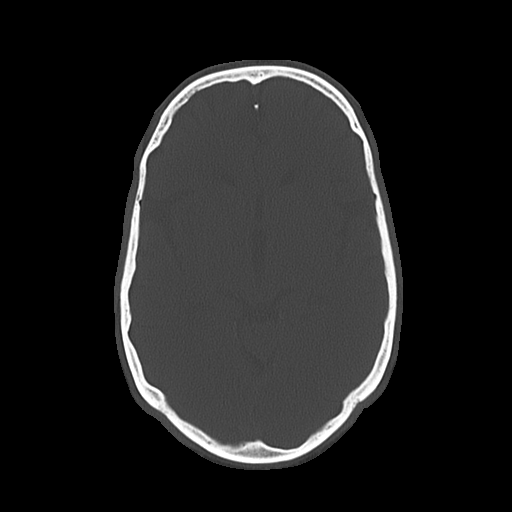
[im 29/53  bone]
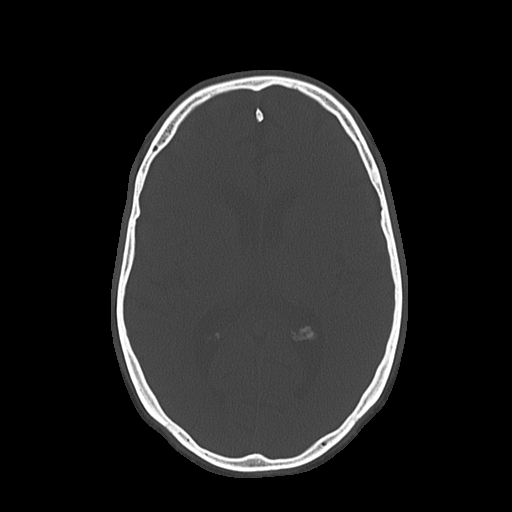
[im 35/53  bone]
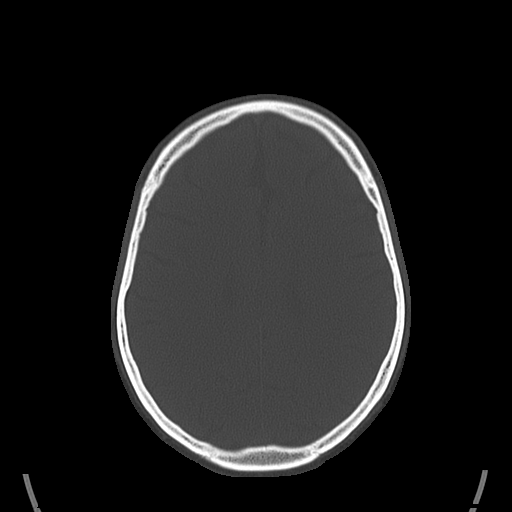
[im 41/53  bone]
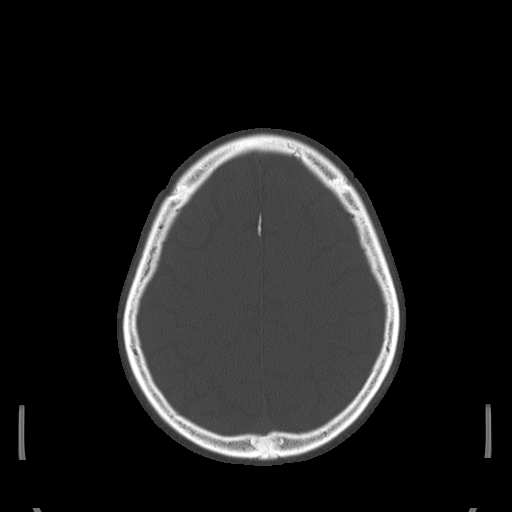
[im 47/53  bone]
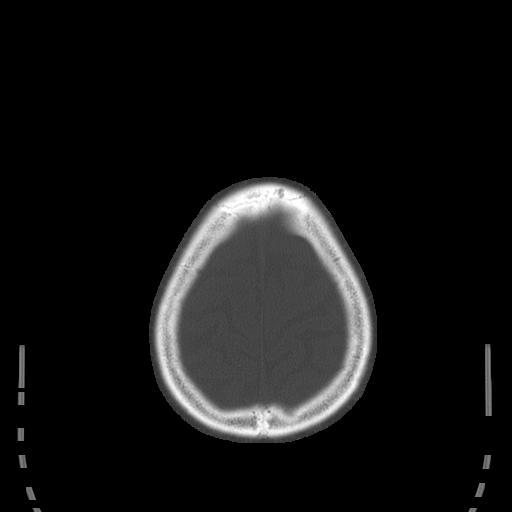

[17 of 30 positions shown; findings below may reference images not displayed]

FINDINGS: No evidence of an acute infarct, acute hemorrhage, mass lesion, mass
effect or hydrocephalus. Mild atrophy. Mild periventricular low
attenuation. Visualized portions of the paranasal sinuses and
mastoid air cells are clear.
IMPRESSION: 1. No acute intracranial abnormality.
2. Atrophy and chronic microvascular white matter ischemic changes.
